# Patient Record
Sex: Male | Born: 1990 | Marital: Single | Smoking: Never smoker
Health system: Northeastern US, Academic
[De-identification: ages and names within clinical notes are randomized; demographics above are authoritative.]

## PROBLEM LIST (undated history)

## (undated) HISTORY — PX: TONSILLECTOMY: SUR1361

## (undated) HISTORY — PX: ADENOIDECTOMY: SHX5191

---
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
## 2009-04-15 ENCOUNTER — Emergency Department (HOSPITAL_COMMUNITY): Admission: EM | Admit: 2009-04-15 | Discharge: 2009-04-16 | Payer: Self-pay | Admitting: Emergency Medicine

## 2011-04-04 LAB — DIFFERENTIAL
Basophils Relative: 0 % (ref 0–1)
Eosinophils Absolute: 0 10*3/uL (ref 0.0–0.7)
Monocytes Absolute: 0.8 10*3/uL (ref 0.1–1.0)
Monocytes Relative: 5 % (ref 3–12)

## 2011-04-04 LAB — COMPREHENSIVE METABOLIC PANEL
ALT: 10 U/L (ref 0–53)
Albumin: 4.2 g/dL (ref 3.5–5.2)
Alkaline Phosphatase: 78 U/L (ref 39–117)
Glucose, Bld: 104 mg/dL — ABNORMAL HIGH (ref 70–99)
Potassium: 3.5 mEq/L (ref 3.5–5.1)
Sodium: 137 mEq/L (ref 135–145)
Total Protein: 7.3 g/dL (ref 6.0–8.3)

## 2011-04-04 LAB — CBC
Hemoglobin: 13.6 g/dL (ref 13.0–17.0)
Platelets: 215 10*3/uL (ref 150–400)
RDW: 13.3 % (ref 11.5–15.5)
WBC: 14.8 10*3/uL — ABNORMAL HIGH (ref 4.0–10.5)

## 2011-07-04 ENCOUNTER — Encounter: Payer: Self-pay | Admitting: *Deleted

## 2011-07-04 ENCOUNTER — Emergency Department (HOSPITAL_COMMUNITY)
Admission: EM | Admit: 2011-07-04 | Discharge: 2011-07-04 | Disposition: A | Discharge status: Home / Self Care | Payer: Self-pay | Attending: Emergency Medicine | Admitting: Emergency Medicine

## 2011-07-04 DIAGNOSIS — J069 Acute upper respiratory infection, unspecified: Secondary | ICD-10-CM | POA: Insufficient documentation

## 2011-07-04 MED ORDER — ALBUTEROL SULFATE HFA 108 (90 BASE) MCG/ACT IN AERS
2.0000 | INHALATION_SPRAY | Freq: Once | RESPIRATORY_TRACT | Status: AC
  Administered 2011-07-04: 2 via RESPIRATORY_TRACT
  Filled 2011-07-04: qty 6.7

## 2011-07-04 NOTE — ED Notes (Signed)
Pt c/o cough and body aches since Sunday. Denies productive cough. Denies fever.

## 2011-07-04 NOTE — ED Provider Notes (Addendum)
History     Chief Complaint  Patient presents with  . Cough   Patient is a 20 y.o. male presenting with cough. The history is provided by the patient.  Cough This is a new problem. The current episode started more than 2 days ago. The problem occurs every few hours. The problem has been gradually worsening. The cough is non-productive. Associated symptoms include sore throat and myalgias. Pertinent negatives include no chest pain, no sweats, no ear congestion, no ear pain, no rhinorrhea and no shortness of breath. He is not a smoker.  Pt was seen at 1235pm  Pt reports cough, myalgias and felt like he had a fever. Now just reports persistent cough. No other medical problems. No sick contacts. No new exposures.    History reviewed. No pertinent past medical history.  History reviewed. No pertinent past surgical history.  History reviewed. No pertinent family history.  History  Substance Use Topics  . Smoking status: Never Smoker   . Smokeless tobacco: Not on file  . Alcohol Use: Yes     Drinks occasionally      Review of Systems  HENT: Positive for sore throat. Negative for ear pain and rhinorrhea.   Respiratory: Positive for cough. Negative for shortness of breath.   Cardiovascular: Negative for chest pain.  Musculoskeletal: Positive for myalgias.    Physical Exam  BP 144/75  Pulse 67  Temp(Src) 98.9 F (37.2 C) (Oral)  Resp 20  Ht 5\' 7"  (1.702 m)  Wt 145 lb (65.772 kg)  BMI 22.71 kg/m2  SpO2 100%  Physical Exam   CONSTITUTIONAL: Well developed/well nourished HEAD AND FACE: Normocephalic/atraumatic EYES: EOMI/PERRL ENMT: Mucous membranes moist, pharynx normal NECK: supple no meningeal signs SPINE:entire spine nontender CV: S1/S2 noted, no murmurs/rubs/gallops noted LUNGS: Lungs are clear to auscultation bilaterally, no apparent distress but he does cough during exam ABDOMEN: soft, nontender, no rebound or guarding  NEURO: Pt is awake/alert, moves all  extremitiesx4 EXTREMITIES: pulses normal, full ROM, no edema noted SKIN: warm, color normal PSYCH: no abnormalities of mood noted ED Course  Procedures  MDM Nursing notes reviewed and considered in documentation Pt well appearing, likely URI.  Stable for d/c.  No indication for imaging at this time      Joya Gaskins, MD 07/04/11 1258  Joya Gaskins, MD 07/04/11 1259

## 2011-10-05 LAB — STREP A DNA PROBE

## 2013-01-15 ENCOUNTER — Emergency Department (HOSPITAL_COMMUNITY)
Admission: EM | Admit: 2013-01-15 | Discharge: 2013-01-15 | Disposition: A | Discharge status: Home / Self Care | Payer: Worker's Compensation | Attending: Emergency Medicine | Admitting: Emergency Medicine

## 2013-01-15 ENCOUNTER — Encounter (HOSPITAL_COMMUNITY): Payer: Self-pay | Admitting: Emergency Medicine

## 2013-01-15 DIAGNOSIS — S61419A Laceration without foreign body of unspecified hand, initial encounter: Secondary | ICD-10-CM

## 2013-01-15 DIAGNOSIS — S61409A Unspecified open wound of unspecified hand, initial encounter: Secondary | ICD-10-CM | POA: Insufficient documentation

## 2013-01-15 DIAGNOSIS — W292XXA Contact with other powered household machinery, initial encounter: Secondary | ICD-10-CM | POA: Insufficient documentation

## 2013-01-15 DIAGNOSIS — Y990 Civilian activity done for income or pay: Secondary | ICD-10-CM | POA: Insufficient documentation

## 2013-01-15 DIAGNOSIS — Y9289 Other specified places as the place of occurrence of the external cause: Secondary | ICD-10-CM | POA: Insufficient documentation

## 2013-01-15 DIAGNOSIS — Y939 Activity, unspecified: Secondary | ICD-10-CM | POA: Insufficient documentation

## 2013-01-15 DIAGNOSIS — Z23 Encounter for immunization: Secondary | ICD-10-CM | POA: Insufficient documentation

## 2013-01-15 DIAGNOSIS — Y929 Unspecified place or not applicable: Secondary | ICD-10-CM | POA: Insufficient documentation

## 2013-01-15 MED ORDER — HYDROCODONE-ACETAMINOPHEN 5-325 MG PO TABS: ORAL_TABLET | ORAL | Status: DC

## 2013-01-15 MED ORDER — DOXYCYCLINE HYCLATE 100 MG PO CAPS: 100.0000 mg | ORAL_CAPSULE | Freq: Two times a day (BID) | ORAL | Status: DC

## 2013-01-15 MED ORDER — TETANUS-DIPHTH-ACELL PERTUSSIS 5-2.5-18.5 LF-MCG/0.5 IM SUSP
0.5000 mL | Freq: Once | INTRAMUSCULAR | Status: AC
  Administered 2013-01-15: 0.5 mL via INTRAMUSCULAR
  Filled 2013-01-15: qty 0.5

## 2013-01-15 MED ORDER — LIDOCAINE HCL (PF) 2 % IJ SOLN
10.0000 mL | Freq: Once | INTRAMUSCULAR | Status: DC
  Filled 2013-01-15: qty 10

## 2013-01-15 MED ORDER — DOUBLE ANTIBIOTIC 500-10000 UNIT/GM EX OINT
TOPICAL_OINTMENT | Freq: Once | CUTANEOUS | Status: DC
  Filled 2013-01-15 (×2): qty 1

## 2013-01-15 NOTE — ED Provider Notes (Signed)
History     CSN: [id number]  Arrival date & time 01/15/13  4098   First MD Initiated Contact with Patient 01/15/13 [phone]      Chief Complaint  Patient presents with  . Laceration    (Consider location/radiation/quality/duration/timing/severity/associated sxs/prior treatment) Patient is a 22 y.o. male presenting with skin laceration. The history is provided by the patient.  Laceration  The incident occurred 1 to 2 hours ago. The laceration is located on the left hand. The laceration is 1 cm in size. The laceration mechanism was a a metal edge. The pain is moderate. The pain has been constant since onset. He reports no foreign bodies present. His tetanus status is out of date.    History reviewed. No pertinent past medical history.  Past Surgical History  Procedure Date  . Tonsillectomy   . Adenoidectomy     No family history on file.  History  Substance Use Topics  . Smoking status: Never Smoker   . Smokeless tobacco: Not on file  . Alcohol Use: Yes     Comment: Drinks occasionally      Review of Systems  Constitutional: Negative for activity change.       All ROS Neg except as noted in HPI  HENT: Negative for nosebleeds and neck pain.   Eyes: Negative for photophobia and discharge.  Respiratory: Negative for cough, shortness of breath and wheezing.   Cardiovascular: Negative for chest pain and palpitations.  Gastrointestinal: Negative for abdominal pain and blood in stool.  Genitourinary: Negative for dysuria, frequency and hematuria.  Musculoskeletal: Negative for back pain and arthralgias.  Skin: Negative.   Neurological: Negative for dizziness, seizures and speech difficulty.  Psychiatric/Behavioral: Negative for hallucinations and confusion.    Allergies  Review of patient's allergies indicates no known allergies.  Home Medications   Current Outpatient Rx  Name  Route  Sig  Dispense  Refill  . DOXYCYCLINE HYCLATE 100 MG PO CAPS   Oral   Take 1  capsule (100 mg total) by mouth 2 (two) times daily.   14 capsule   0   . HYDROCODONE-ACETAMINOPHEN 5-325 MG PO TABS      1 or 2 po q4h prn pain   14 tablet   0     BP 137/81  Pulse 50  Temp 97.8 F (36.6 C)  Resp 18  Ht 5\' 8"  (1.727 m)  Wt 150 lb (68.04 kg)  BMI 22.81 kg/m2  SpO2 99%  Physical Exam  Nursing note and vitals reviewed. Constitutional: He is oriented to person, place, and time. He appears well-developed and well-nourished.  Non-toxic appearance.  HENT:  Head: Normocephalic.  Right Ear: Tympanic membrane and external ear normal.  Left Ear: Tympanic membrane and external ear normal.  Eyes: EOM and lids are normal. Pupils are equal, round, and reactive to light.  Neck: Normal range of motion. Neck supple. Carotid bruit is not present.  Cardiovascular: Normal rate, regular rhythm, normal heart sounds, intact distal pulses and normal pulses.   Pulmonary/Chest: Breath sounds normal. No respiratory distress.  Abdominal: Soft. Bowel sounds are normal. There is no tenderness. There is no guarding.  Musculoskeletal: Normal range of motion.       PUNCTURE/LACERATION OF THE WEB SPACE BETWEEN LEFT THUMB AND 2ND FINGER. mILD TO MODERATE SWELLING PRESENT. FROM OF THE THUMB AND 2ND FINGER.  Lymphadenopathy:       Head (right side): No submandibular adenopathy present.       Head (left side): No submandibular  adenopathy present.    He has no cervical adenopathy.  Neurological: He is alert and oriented to person, place, and time. He has normal strength. No cranial nerve deficit or sensory deficit.       No sensory deficits of the left hand. Grip symmetrical.  Skin: Skin is warm and dry.  Psychiatric: He has a normal mood and affect. His speech is normal.    ED Course  Procedures: Laceration repair LEFT HAND.  The patient was identified by arm band. Permission for the procedure was given by the patient. Procedural time out was taken before repair of the laceration/puncture  of the left hand. The procedure was explained to the patient in terms which she could understand. The left hand was then painted with Betadine. The wound area was infiltrated with 2% plain lidocaine. After good anesthetic was achieved the wound was investigated and irrigated. There is no pulsatile mass appreciated. There was a mild to moderate hematoma around the laceration. There is no foreign body noted. No pulsating vessel or mass. Wound  was then painted again with Betadine and using sterile technique the wound was repaired with 2 staples. The wound measures 1.1 cm. A sterile dressing was applied. The patient tolerated the procedure without any problem.  After the repair the patient has full range of motion of the left thumb and index finger. Sensory is intact.  Labs Reviewed - No data to display No results found.   1. Laceration of hand      MDM  I have reviewed nursing notes, vital signs, and all appropriate lab and imaging results for this patient. The pt sustained a puncture/laceration of the left hand. Wound repaired. Tetanus updated. Pt to have staples removed in 7 days unless problems before then. Pt placed on doxycycline as a precaution due to puncture to the hand.       Kathie Dike, [place] 01/15/13 1043

## 2013-01-15 NOTE — ED Provider Notes (Signed)
Medical screening examination/treatment/procedure(s) were performed by non-physician practitioner and as supervising physician I was immediately available for consultation/collaboration.   Aldous Housel L Sincere Berlanga, MD 01/15/13 1629 

## 2013-01-15 NOTE — ED Notes (Signed)
Pt accidentally cut his left hand with metal knife while at work this am. Last tetanus unknown.

## 2013-01-20 ENCOUNTER — Encounter (HOSPITAL_COMMUNITY): Payer: Self-pay | Admitting: *Deleted

## 2013-01-20 ENCOUNTER — Emergency Department (HOSPITAL_COMMUNITY)
Admission: EM | Admit: 2013-01-20 | Discharge: 2013-01-20 | Disposition: A | Discharge status: Home / Self Care | Payer: Worker's Compensation | Attending: Emergency Medicine | Admitting: Emergency Medicine

## 2013-01-20 DIAGNOSIS — IMO0001 Reserved for inherently not codable concepts without codable children: Secondary | ICD-10-CM

## 2013-01-20 DIAGNOSIS — Z4802 Encounter for removal of sutures: Secondary | ICD-10-CM | POA: Insufficient documentation

## 2013-01-20 DIAGNOSIS — Z4800 Encounter for change or removal of nonsurgical wound dressing: Secondary | ICD-10-CM | POA: Insufficient documentation

## 2013-01-20 NOTE — ED Provider Notes (Signed)
History     CSN: [id number]  Arrival date & time 01/20/13  1542   First MD Initiated Contact with Patient 01/20/13 1603      Chief Complaint  Patient presents with  . Suture / Staple Removal    (Consider location/radiation/quality/duration/timing/severity/associated sxs/prior treatment) HPI Comments: Patient was seen here 5 days ago and treated for a laceration to his left hand.  Requesting wound check and staple removal.  He reports swelling to the left index finger, but denies numbness, weakness, redness or drainage surrounding the wound.    Patient is a 22 y.o. male presenting with wound check. The history is provided by the patient.  Wound Check  He was treated in the ED 3 to 5 days ago. Previous treatment in the ED includes laceration repair. Treatments since wound repair include regular soap and water washings. Fever duration: no fever. There has been no drainage from the wound. There is no redness present. The swelling has not changed. The pain has no pain. He has no difficulty moving the affected extremity or digit.    History reviewed. No pertinent past medical history.  Past Surgical History  Procedure Date  . Tonsillectomy   . Adenoidectomy     History reviewed. No pertinent family history.  History  Substance Use Topics  . Smoking status: Never Smoker   . Smokeless tobacco: Not on file  . Alcohol Use: Yes     Comment: Drinks occasionally      Review of Systems  Constitutional: Negative for fever and chills.  Musculoskeletal: Positive for joint swelling. Negative for back pain and arthralgias.  Skin: Positive for wound.       Laceration   Neurological: Negative for dizziness, weakness and numbness.  Hematological: Does not bruise/bleed easily.  All other systems reviewed and are negative.    Allergies  Vibramycin  Home Medications   Current Outpatient Rx  Name  Route  Sig  Dispense  Refill  . DOXYCYCLINE HYCLATE 100 MG PO CAPS   Oral   Take 1  capsule (100 mg total) by mouth 2 (two) times daily.   14 capsule   0   . HYDROCODONE-ACETAMINOPHEN 5-325 MG PO TABS      1 or 2 po q4h prn pain   14 tablet   0     BP 135/63  Pulse 64  SpO2 100%  Physical Exam  Nursing note and vitals reviewed. Constitutional: He is oriented to person, place, and time. He appears well-developed and well-nourished. No distress.  HENT:  Head: Normocephalic and atraumatic.  Mouth/Throat: Oropharynx is clear and moist.  Neck: Normal range of motion. Neck supple.  Cardiovascular: Normal rate, regular rhythm and normal heart sounds.   No murmur heard. Pulmonary/Chest: Effort normal and breath sounds normal.  Musculoskeletal: He exhibits edema. He exhibits no tenderness.       Left hand: He exhibits laceration. He exhibits normal range of motion, no tenderness, no bony tenderness, normal two-point discrimination, normal capillary refill and no deformity. normal sensation noted. Normal strength noted.       Hands:      Sensation intact, CR< 2 sec, radial pulse brisk  Lymphadenopathy:    He has no cervical adenopathy.  Neurological: He is alert and oriented to person, place, and time. He exhibits normal muscle tone. Coordination normal.  Skin: Laceration noted.       See MS exam    ED Course  Procedures (including critical care time)  Labs Reviewed - No  data to display No results found.      MDM   Previous ed chart reviewed.     Two staples to the first web space of the  Left hand.  Appears to be healing well. No wound dehiscence.   Pt has full ROM of the fingers, distal sensation intact.  Will leave staples in 3 more days.  Pt agrees to return then        Katiria Calame L. [place], [place] 01/20/13 1934

## 2013-01-20 NOTE — ED Notes (Addendum)
Here for staple removal from lt hand Staples placed 1/ 23. Stopped taking antibiotic, says it caused a headache.

## 2013-01-20 NOTE — ED Provider Notes (Signed)
Medical screening examination/treatment/procedure(s) were performed by non-physician practitioner and as supervising physician I was immediately available for consultation/collaboration.   Shelda Jakes, MD 01/20/13 2009

## 2013-01-24 ENCOUNTER — Emergency Department (HOSPITAL_COMMUNITY)
Admission: EM | Admit: 2013-01-24 | Discharge: 2013-01-24 | Disposition: A | Discharge status: Home / Self Care | Payer: Worker's Compensation | Attending: Emergency Medicine | Admitting: Emergency Medicine

## 2013-01-24 ENCOUNTER — Encounter (HOSPITAL_COMMUNITY): Payer: Self-pay | Admitting: *Deleted

## 2013-01-24 DIAGNOSIS — Z4802 Encounter for removal of sutures: Secondary | ICD-10-CM | POA: Insufficient documentation

## 2013-01-24 NOTE — ED Notes (Signed)
Staples removed and steri stirps placed to area. NAD

## 2013-01-24 NOTE — ED Provider Notes (Addendum)
History     CSN: [id number]  Arrival date & time 01/24/13  1350   First MD Initiated Contact with Patient 01/24/13 1357       chief complaint: Staple removal  The history is provided by the patient and medical records.   staples placed in his left hand 10 days ago.  Request removal today.  No fevers or chills or redness.  No drainage.  No other complaints.  History reviewed. No pertinent past medical history.  Past Surgical History  Procedure Date  . Tonsillectomy   . Adenoidectomy     No family history on file.  History  Substance Use Topics  . Smoking status: Never Smoker   . Smokeless tobacco: Not on file  . Alcohol Use: Yes     Comment: Drinks occasionally      Review of Systems  All other systems reviewed and are negative.    Allergies  Vibramycin  Home Medications   Current Outpatient Rx  Name  Route  Sig  Dispense  Refill  . DOXYCYCLINE HYCLATE 100 MG PO CAPS   Oral   Take 1 capsule (100 mg total) by mouth 2 (two) times daily.   14 capsule   0   . HYDROCODONE-ACETAMINOPHEN 5-325 MG PO TABS      1 or 2 po q4h prn pain   14 tablet   0     BP 117/68  Pulse 63  Temp 97.8 F (36.6 C) (Oral)  Resp 16  SpO2 98%  Physical Exam  Constitutional: He is oriented to person, place, and time. He appears well-developed and well-nourished.  HENT:  Head: Normocephalic.  Eyes: EOM are normal.  Neck: Normal range of motion.  Pulmonary/Chest: Effort normal.  Abdominal: He exhibits no distension.  Musculoskeletal: Normal range of motion.       Healing staples over the dorsum of his left hand.  No signs of infection.  Neurological: He is alert and oriented to person, place, and time.  Psychiatric: He has a normal mood and affect.    ED Course  Procedures (including critical care time)  SUTURE REMOVAL Performed by: Lyanne Co Consent: Verbal consent obtained. Patient identity confirmed: provided demographic data Time out: Immediately prior to  procedure a "time out" was called to verify the correct patient, procedure, equipment, support staff and site/side marked as required. Location: left hand Wound Appearance: clean Sutures/Staples Removed: 2 Patient tolerance: Patient tolerated the procedure well with no immediate complications.     Labs Reviewed - No data to display No results found.   1. Removal of staple       MDM  No signs of infection. steri strips applied        Lyanne Co, MD 01/24/13 1400  Lyanne Co, MD 01/24/13 [phone]

## 2013-01-24 NOTE — ED Notes (Signed)
Staple removal

## 2016-06-09 ENCOUNTER — Emergency Department (HOSPITAL_COMMUNITY): Payer: No Typology Code available for payment source

## 2016-06-09 ENCOUNTER — Encounter (HOSPITAL_COMMUNITY)
Admission: EM | Disposition: A | Discharge status: Home / Self Care | Payer: Self-pay | Source: Home / Self Care | Attending: Emergency Medicine

## 2016-06-09 ENCOUNTER — Observation Stay (HOSPITAL_COMMUNITY)
Admission: EM | Admit: 2016-06-09 | Discharge: 2016-06-11 | Disposition: A | Discharge status: Home / Self Care | Payer: No Typology Code available for payment source | Attending: Orthopedic Surgery | Admitting: Orthopedic Surgery

## 2016-06-09 ENCOUNTER — Emergency Department (HOSPITAL_COMMUNITY): Payer: No Typology Code available for payment source | Admitting: Anesthesiology

## 2016-06-09 ENCOUNTER — Encounter (HOSPITAL_COMMUNITY): Payer: Self-pay | Admitting: Emergency Medicine

## 2016-06-09 DIAGNOSIS — R079 Chest pain, unspecified: Secondary | ICD-10-CM | POA: Diagnosis not present

## 2016-06-09 DIAGNOSIS — Y999 Unspecified external cause status: Secondary | ICD-10-CM | POA: Insufficient documentation

## 2016-06-09 DIAGNOSIS — S73004A Unspecified dislocation of right hip, initial encounter: Principal | ICD-10-CM | POA: Insufficient documentation

## 2016-06-09 DIAGNOSIS — Y92410 Unspecified street and highway as the place of occurrence of the external cause: Secondary | ICD-10-CM | POA: Insufficient documentation

## 2016-06-09 DIAGNOSIS — R51 Headache: Secondary | ICD-10-CM | POA: Insufficient documentation

## 2016-06-09 DIAGNOSIS — S79911A Unspecified injury of right hip, initial encounter: Secondary | ICD-10-CM | POA: Diagnosis present

## 2016-06-09 DIAGNOSIS — S00211A Abrasion of right eyelid and periocular area, initial encounter: Secondary | ICD-10-CM | POA: Diagnosis not present

## 2016-06-09 DIAGNOSIS — S72009A Fracture of unspecified part of neck of unspecified femur, initial encounter for closed fracture: Secondary | ICD-10-CM | POA: Diagnosis present

## 2016-06-09 DIAGNOSIS — S72001A Fracture of unspecified part of neck of right femur, initial encounter for closed fracture: Secondary | ICD-10-CM

## 2016-06-09 DIAGNOSIS — R52 Pain, unspecified: Secondary | ICD-10-CM

## 2016-06-09 DIAGNOSIS — R1084 Generalized abdominal pain: Secondary | ICD-10-CM | POA: Diagnosis not present

## 2016-06-09 DIAGNOSIS — Y939 Activity, unspecified: Secondary | ICD-10-CM | POA: Diagnosis not present

## 2016-06-09 HISTORY — PX: HIP CLOSED REDUCTION: SHX983

## 2016-06-09 LAB — BASIC METABOLIC PANEL
Anion gap: 4 — ABNORMAL LOW (ref 5–15)
BUN: 16 mg/dL (ref 6–20)
CHLORIDE: 103 mmol/L (ref 101–111)
CO2: 25 mmol/L (ref 22–32)
Calcium: 8.3 mg/dL — ABNORMAL LOW (ref 8.9–10.3)
Creatinine, Ser: 1.02 mg/dL (ref 0.61–1.24)
GFR calc Af Amer: >60 mL/min (ref >60)
GFR calc non Af Amer: >60 mL/min (ref >60)
GLUCOSE: 108 mg/dL — AB (ref 65–99)
POTASSIUM: 3.9 mmol/L (ref 3.5–5.1)
Sodium: 132 mmol/L — ABNORMAL LOW (ref 135–145)

## 2016-06-09 LAB — I-STAT CHEM 8, ED
BUN: 18 mg/dL (ref 6–20)
CALCIUM ION: 1.16 mmol/L (ref 1.12–1.23)
Chloride: 101 mmol/L (ref 101–111)
Creatinine, Ser: 1.3 mg/dL — ABNORMAL HIGH (ref 0.61–1.24)
GLUCOSE: 99 mg/dL (ref 65–99)
HCT: 44 % (ref 39.0–52.0)
Hemoglobin: 15 g/dL (ref 13.0–17.0)
Potassium: 3.3 mmol/L — ABNORMAL LOW (ref 3.5–5.1)
SODIUM: 140 mmol/L (ref 135–145)
TCO2: 25 mmol/L (ref 0–100)

## 2016-06-09 LAB — CBC WITH DIFFERENTIAL/PLATELET
Basophils Absolute: 0 10*3/uL (ref 0.0–0.1)
Basophils Relative: 0 %
EOS PCT: 0 %
Eosinophils Absolute: 0 10*3/uL (ref 0.0–0.7)
HEMATOCRIT: 40.8 % (ref 39.0–52.0)
HEMOGLOBIN: 14.2 g/dL (ref 13.0–17.0)
LYMPHS ABS: 0.8 10*3/uL (ref 0.7–4.0)
LYMPHS PCT: 5 %
MCH: 29.3 pg (ref 26.0–34.0)
MCHC: 34.8 g/dL (ref 30.0–36.0)
MCV: 84.1 fL (ref 78.0–100.0)
Monocytes Absolute: 0.6 10*3/uL (ref 0.1–1.0)
Monocytes Relative: 3 %
NEUTROS ABS: 16.1 10*3/uL — AB (ref 1.7–7.7)
NEUTROS PCT: 92 %
Platelets: 253 10*3/uL (ref 150–400)
RBC: 4.85 MIL/uL (ref 4.22–5.81)
RDW: 12.5 % (ref 11.5–15.5)
WBC: 17.5 10*3/uL — AB (ref 4.0–10.5)

## 2016-06-09 SURGERY — CLOSED REDUCTION, HIP
Anesthesia: General | Laterality: Right

## 2016-06-09 MED ORDER — ONDANSETRON HCL 4 MG/2ML IJ SOLN: 4.0000 mg | Freq: Four times a day (QID) | INTRAMUSCULAR | Status: DC | PRN

## 2016-06-09 MED ORDER — FENTANYL CITRATE (PF) 100 MCG/2ML IJ SOLN
INTRAMUSCULAR | Status: DC | PRN
  Administered 2016-06-09 (×3): 50 ug via INTRAVENOUS

## 2016-06-09 MED ORDER — PHENYLEPHRINE 40 MCG/ML (10ML) SYRINGE FOR IV PUSH (FOR BLOOD PRESSURE SUPPORT)
PREFILLED_SYRINGE | INTRAVENOUS | Status: AC
  Filled 2016-06-09: qty 10

## 2016-06-09 MED ORDER — METOCLOPRAMIDE HCL 5 MG/ML IJ SOLN: 5.0000 mg | Freq: Three times a day (TID) | INTRAMUSCULAR | Status: DC | PRN

## 2016-06-09 MED ORDER — PROPOFOL 10 MG/ML IV BOLUS
INTRAVENOUS | Status: DC | PRN
  Administered 2016-06-09: 130 mg via INTRAVENOUS

## 2016-06-09 MED ORDER — KETOROLAC TROMETHAMINE 15 MG/ML IJ SOLN
15.0000 mg | Freq: Four times a day (QID) | INTRAMUSCULAR | Status: AC
  Administered 2016-06-09 – 2016-06-10 (×4): 15 mg via INTRAVENOUS
  Filled 2016-06-09 (×4): qty 1

## 2016-06-09 MED ORDER — CELECOXIB 100 MG PO CAPS
200.0000 mg | ORAL_CAPSULE | Freq: Two times a day (BID) | ORAL | Status: DC
  Administered 2016-06-09 – 2016-06-11 (×5): 200 mg via ORAL
  Filled 2016-06-09 (×5): qty 2

## 2016-06-09 MED ORDER — MIDAZOLAM HCL 5 MG/5ML IJ SOLN
INTRAMUSCULAR | Status: DC | PRN
  Administered 2016-06-09 (×2): 1 mg via INTRAVENOUS

## 2016-06-09 MED ORDER — LIDOCAINE HCL (PF) 1 % IJ SOLN
INTRAMUSCULAR | Status: AC
  Filled 2016-06-09: qty 5

## 2016-06-09 MED ORDER — DIPHENHYDRAMINE HCL 12.5 MG/5ML PO ELIX: 12.5000 mg | ORAL_SOLUTION | ORAL | Status: DC | PRN

## 2016-06-09 MED ORDER — ACETAMINOPHEN 650 MG RE SUPP: 650.0000 mg | Freq: Four times a day (QID) | RECTAL | Status: DC | PRN

## 2016-06-09 MED ORDER — CHLORHEXIDINE GLUCONATE 4 % EX LIQD
60.0000 mL | Freq: Once | CUTANEOUS | Status: DC
  Filled 2016-06-09: qty 60

## 2016-06-09 MED ORDER — SODIUM CHLORIDE 0.9 % IV SOLN
INTRAVENOUS | Status: DC
  Administered 2016-06-09: 04:00:00 via INTRAVENOUS

## 2016-06-09 MED ORDER — MIDAZOLAM HCL 2 MG/2ML IJ SOLN
INTRAMUSCULAR | Status: AC
  Filled 2016-06-09: qty 4

## 2016-06-09 MED ORDER — SUCCINYLCHOLINE CHLORIDE 200 MG/10ML IV SOSY
PREFILLED_SYRINGE | INTRAVENOUS | Status: DC | PRN
  Administered 2016-06-09: 120 mg via INTRAVENOUS

## 2016-06-09 MED ORDER — SUGAMMADEX SODIUM 500 MG/5ML IV SOLN
INTRAVENOUS | Status: DC | PRN
  Administered 2016-06-09: 250 mg via INTRAVENOUS

## 2016-06-09 MED ORDER — POVIDONE-IODINE 10 % EX SWAB: 2.0000 "application " | Freq: Once | CUTANEOUS | Status: DC

## 2016-06-09 MED ORDER — POLYETHYLENE GLYCOL 3350 17 G PO PACK: 17.0000 g | PACK | Freq: Every day | ORAL | Status: DC | PRN

## 2016-06-09 MED ORDER — SEVOFLURANE IN SOLN
RESPIRATORY_TRACT | Status: AC
  Filled 2016-06-09: qty 250

## 2016-06-09 MED ORDER — BISACODYL 10 MG RE SUPP: 10.0000 mg | Freq: Every day | RECTAL | Status: DC | PRN

## 2016-06-09 MED ORDER — ONDANSETRON HCL 4 MG/2ML IJ SOLN
4.0000 mg | INTRAMUSCULAR | Status: DC | PRN
  Administered 2016-06-09: 4 mg via INTRAVENOUS
  Filled 2016-06-09: qty 2

## 2016-06-09 MED ORDER — HYDROCODONE-ACETAMINOPHEN 7.5-325 MG PO TABS
1.0000 | ORAL_TABLET | Freq: Four times a day (QID) | ORAL | Status: DC
  Administered 2016-06-09 – 2016-06-11 (×9): 1 via ORAL
  Filled 2016-06-09 (×9): qty 1

## 2016-06-09 MED ORDER — OXYCODONE HCL 5 MG PO TABS: 5.0000 mg | ORAL_TABLET | ORAL | Status: DC | PRN

## 2016-06-09 MED ORDER — SENNA 8.6 MG PO TABS
1.0000 | ORAL_TABLET | Freq: Two times a day (BID) | ORAL | Status: DC
  Administered 2016-06-09 – 2016-06-11 (×5): 8.6 mg via ORAL
  Filled 2016-06-09 (×5): qty 1

## 2016-06-09 MED ORDER — ROCURONIUM BROMIDE 100 MG/10ML IV SOLN
INTRAVENOUS | Status: DC | PRN
  Administered 2016-06-09: 5 mg via INTRAVENOUS
  Administered 2016-06-09: 25 mg via INTRAVENOUS
  Administered 2016-06-09 (×2): 10 mg via INTRAVENOUS

## 2016-06-09 MED ORDER — IOPAMIDOL (ISOVUE-300) INJECTION 61%
100.0000 mL | Freq: Once | INTRAVENOUS | Status: AC | PRN
  Administered 2016-06-09: 100 mL via INTRAVENOUS

## 2016-06-09 MED ORDER — PHENOL 1.4 % MT LIQD: 1.0000 | OROMUCOSAL | Status: DC | PRN

## 2016-06-09 MED ORDER — HYDROMORPHONE HCL 1 MG/ML IJ SOLN: 0.5000 mg | INTRAMUSCULAR | Status: DC | PRN

## 2016-06-09 MED ORDER — PROPOFOL 10 MG/ML IV BOLUS
0.5000 mg/kg | Freq: Once | INTRAVENOUS | Status: AC
  Administered 2016-06-09: 39 mg via INTRAVENOUS
  Filled 2016-06-09: qty 20

## 2016-06-09 MED ORDER — METHOCARBAMOL 1000 MG/10ML IJ SOLN
500.0000 mg | Freq: Four times a day (QID) | INTRAVENOUS | Status: DC | PRN
  Filled 2016-06-09: qty 5

## 2016-06-09 MED ORDER — METHOCARBAMOL 500 MG PO TABS
500.0000 mg | ORAL_TABLET | Freq: Four times a day (QID) | ORAL | Status: DC | PRN
  Administered 2016-06-09 – 2016-06-10 (×4): 500 mg via ORAL
  Filled 2016-06-09 (×4): qty 1

## 2016-06-09 MED ORDER — LIDOCAINE HCL 1 % IJ SOLN
INTRAMUSCULAR | Status: DC | PRN
  Administered 2016-06-09: 40 mg via INTRADERMAL

## 2016-06-09 MED ORDER — ACETAMINOPHEN 325 MG PO TABS: 650.0000 mg | ORAL_TABLET | Freq: Four times a day (QID) | ORAL | Status: DC | PRN

## 2016-06-09 MED ORDER — SUCCINYLCHOLINE CHLORIDE 20 MG/ML IJ SOLN
INTRAMUSCULAR | Status: AC
  Filled 2016-06-09: qty 1

## 2016-06-09 MED ORDER — ROCURONIUM BROMIDE 50 MG/5ML IV SOLN
INTRAVENOUS | Status: AC
  Filled 2016-06-09: qty 1

## 2016-06-09 MED ORDER — PROPOFOL 10 MG/ML IV BOLUS
INTRAVENOUS | Status: AC
  Filled 2016-06-09: qty 20

## 2016-06-09 MED ORDER — MENTHOL 3 MG MT LOZG: 1.0000 | LOZENGE | OROMUCOSAL | Status: DC | PRN

## 2016-06-09 MED ORDER — METOCLOPRAMIDE HCL 10 MG PO TABS: 5.0000 mg | ORAL_TABLET | Freq: Three times a day (TID) | ORAL | Status: DC | PRN

## 2016-06-09 MED ORDER — SUGAMMADEX SODIUM 500 MG/5ML IV SOLN
INTRAVENOUS | Status: AC
  Filled 2016-06-09: qty 5

## 2016-06-09 MED ORDER — ENOXAPARIN SODIUM 30 MG/0.3ML ~~LOC~~ SOLN
30.0000 mg | Freq: Two times a day (BID) | SUBCUTANEOUS | Status: DC
  Administered 2016-06-10 – 2016-06-11 (×2): 30 mg via SUBCUTANEOUS
  Filled 2016-06-09 (×3): qty 0.3

## 2016-06-09 MED ORDER — MAGNESIUM CITRATE PO SOLN: 1.0000 | Freq: Once | ORAL | Status: DC | PRN

## 2016-06-09 MED ORDER — FENTANYL CITRATE (PF) 100 MCG/2ML IJ SOLN
50.0000 ug | INTRAMUSCULAR | Status: AC | PRN
  Administered 2016-06-09 (×2): 25 ug via INTRAVENOUS
  Filled 2016-06-09: qty 2

## 2016-06-09 MED ORDER — ONDANSETRON HCL 4 MG PO TABS: 4.0000 mg | ORAL_TABLET | Freq: Four times a day (QID) | ORAL | Status: DC | PRN

## 2016-06-09 MED ORDER — MORPHINE SULFATE (PF) 4 MG/ML IV SOLN
4.0000 mg | INTRAVENOUS | Status: DC | PRN
Start: 2016-06-09 — End: 2016-06-09
  Administered 2016-06-09: 4 mg via INTRAVENOUS
  Filled 2016-06-09: qty 1

## 2016-06-09 MED ORDER — LACTATED RINGERS IV SOLN
INTRAVENOUS | Status: DC | PRN
  Administered 2016-06-09 (×2): via INTRAVENOUS

## 2016-06-09 SURGICAL SUPPLY — 10 items
CLOTH BEACON ORANGE TIMEOUT ST (SAFETY) ×3 IMPLANT
GLOVE BIOGEL PI IND STRL 7.0 (GLOVE) ×1 IMPLANT
GLOVE BIOGEL PI INDICATOR 7.0 (GLOVE) ×2
GOWN STRL REUS W/TWL LRG LVL3 (GOWN DISPOSABLE) IMPLANT
GOWN STRL REUS W/TWL XL LVL3 (GOWN DISPOSABLE) IMPLANT
IMMOBILIZER KNEE 19 UNV (ORTHOPEDIC SUPPLIES) ×3 IMPLANT
KIT ROOM TURNOVER APOR (KITS) ×3 IMPLANT
PILLOW HIP ABDUCTION LRG (ORTHOPEDIC SUPPLIES) IMPLANT
PILLOW HIP ABDUCTION MED (ORTHOPEDIC SUPPLIES) IMPLANT
PILLOW HIP ABDUCTION SM (ORTHOPEDIC SUPPLIES) IMPLANT

## 2016-06-09 NOTE — Anesthesia Procedure Notes (Signed)
Procedure Name: Intubation Date/Time: 06/09/2016 8:23 AM Performed by: Glynn OctaveANIEL, Diani Jillson E Pre-anesthesia Checklist: Patient identified, Patient being monitored, Timeout performed, Emergency Drugs available and Suction available Patient Re-evaluated:Patient Re-evaluated prior to inductionOxygen Delivery Method: Circle System Utilized Preoxygenation: Pre-oxygenation with 100% oxygen Intubation Type: IV induction, Rapid sequence and Cricoid Pressure applied Ventilation: Mask ventilation without difficulty Laryngoscope Size: Mac and 3 Grade View: Grade II Tube type: Oral Tube size: 7.0 mm Number of attempts: 1 Airway Equipment and Method: stylet Placement Confirmation: ETT inserted through vocal cords under direct vision,  positive ETCO2 and breath sounds checked- equal and bilateral Secured at: 21 cm Tube secured with: Tape Dental Injury: Teeth and Oropharynx as per pre-operative assessment

## 2016-06-09 NOTE — Progress Notes (Signed)
PT Cancellation Note  Patient Details Name: Philis KendallSean R Quirarte MRN: [id number] DOB: April 08, 1991   Cancelled Treatment:    Reason Eval/Treat Not Completed: Pain limiting ability to participate (Stopped by room to check on patient. He reports he does not feel 'groggy' but is wide awake. He says that his pain is 9/10 right now and he is waiting for pain meds. He is aprehensive about AMB at this time and would rather defer until tomorrow morning. PT is agreeable. Will attempt again at later/date time.)   11:38 AM, 06/09/2016 Rosamaria LintsAllan C Buccola, PT, DPT PRN Physical Therapist - [facility] License # [id number] [phone] (mobile)
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
## 2016-06-09 NOTE — ED Notes (Signed)
To radiology via stretcher.

## 2016-06-09 NOTE — Addendum Note (Signed)
Addendum  created 06/09/16 1112 by Moshe SalisburyKaren E Marino Rogerson, CRNA   Modules edited: Anesthesia Flowsheet, Anesthesia Medication Administration

## 2016-06-09 NOTE — Anesthesia Postprocedure Evaluation (Signed)
Anesthesia Post Note  Patient: Jeremiah Anthony  Procedure(s) Performed: Procedure(s) (LRB): CLOSED REDUCTION RIGHT HIP (Right)  Patient location during evaluation: PACU Anesthesia Type: General Level of consciousness: awake and alert and oriented Pain management: pain level controlled Vital Signs Assessment: post-procedure vital signs reviewed and stable Respiratory status: spontaneous breathing Cardiovascular status: blood pressure returned to baseline Postop Assessment: no signs of nausea or vomiting Anesthetic complications: no    Last Vitals:  Filed Vitals:   06/09/16 1000 06/09/16 1015  BP: 111/67 117/69  Pulse: 58 54  Temp:    Resp: 14 13    Last Pain:  Filed Vitals:   06/09/16 1021  PainSc: Asleep                 Mattisyn Cardona

## 2016-06-09 NOTE — Anesthesia Preprocedure Evaluation (Addendum)
Anesthesia Evaluation  Patient identified by MRN, date of birth, ID band Patient awake  General Assessment Comment:Not on a Beta blocker  Reviewed: Allergy & Precautions, NPO status , Patient's Chart, lab work & pertinent test results  History of Anesthesia Complications Negative for: history of anesthetic complications  Airway Mallampati: II  TM Distance: >3 FB Neck ROM: Full    Dental  (+) Teeth Intact   Pulmonary neg pneumonia ,    Pulmonary exam normal        Cardiovascular Exercise Tolerance: Good negative cardio ROS   Rhythm:Regular Rate:Normal     Neuro/Psych    GI/Hepatic negative GI ROS, Neg liver ROS,   Endo/Other  negative endocrine ROS  Renal/GU negative Renal ROS     Musculoskeletal negative musculoskeletal ROS (+)   Abdominal Normal abdominal exam  (+)   Peds  Hematology negative hematology ROS (+)   Anesthesia Other Findings   Reproductive/Obstetrics negative OB ROS                           Anesthesia Physical Anesthesia Plan  ASA: I and emergent  Anesthesia Plan: General   Post-op Pain Management:    Induction: Rapid sequence, Cricoid pressure planned and Intravenous  Airway Management Planned: Oral ETT  Additional Equipment:   Intra-op Plan:   Post-operative Plan:   Informed Consent: I have reviewed the patients History and Physical, chart, labs and discussed the procedure including the risks, benefits and alternatives for the proposed anesthesia with the patient or authorized representative who has indicated his/her understanding and acceptance.     Plan Discussed with: Surgeon  Anesthesia Plan Comments:         Anesthesia Quick Evaluation

## 2016-06-09 NOTE — ED Notes (Signed)
OR crew arrived to assume care of patient.

## 2016-06-09 NOTE — Brief Op Note (Signed)
06/09/2016  9:02 AM  PATIENT:  Jeremiah Anthony  25 y.o. male  PRE-OPERATIVE DIAGNOSIS:  dislocation right hip  POST-OPERATIVE DIAGNOSIS:  dislocation right hip  PROCEDURE:  Procedure(s): CLOSED REDUCTION RIGHT HIP (Right)  SURGEON:  Surgeon(s) and Role:    * Vickki HearingStanley E Harrison, MD - Primary  PHYSICIAN ASSISTANT:   ASSISTANTS: none   ANESTHESIA:   general  EBL:     BLOOD ADMINISTERED:none  DRAINS: none   LOCAL MEDICATIONS USED:  NONE  SPECIMEN:  No Specimen  DISPOSITION OF SPECIMEN:  N/A  COUNTS:  YES  TOURNIQUET:  * No tourniquets in log *  DICTATION: .Dragon Dictation  PLAN OF CARE: Admit for overnight observation  PATIENT DISPOSITION:  PACU - hemodynamically stable.   Delay start of Pharmacological VTE agent (>24hrs) due to surgical blood loss or risk of bleeding: not applicable

## 2016-06-09 NOTE — Transfer of Care (Signed)
Immediate Anesthesia Transfer of Care Note  Patient: Jeremiah Anthony  Procedure(s) Performed: Procedure(s): CLOSED REDUCTION RIGHT HIP (Right)  Patient Location: PACU  Anesthesia Type:General  Level of Consciousness: awake  Airway & Oxygen Therapy: Patient Spontanous Breathing and Patient connected to face mask oxygen  Post-op Assessment: Report given to RN  Post vital signs: Reviewed and stable  Last Vitals:  Filed Vitals:   06/09/16 0730 06/09/16 0745  BP: 126/70 125/76  Pulse:    Temp:    Resp: 13 17    Last Pain:  Filed Vitals:   06/09/16 0756  PainSc: 3          Complications: No apparent anesthesia complications
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
## 2016-06-09 NOTE — ED Notes (Signed)
Spot check o2 sat 100%.
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
## 2016-06-10 DIAGNOSIS — S72001A Fracture of unspecified part of neck of right femur, initial encounter for closed fracture: Secondary | ICD-10-CM | POA: Diagnosis not present

## 2016-06-10 MED ORDER — IBUPROFEN 800 MG PO TABS
800.0000 mg | ORAL_TABLET | Freq: Three times a day (TID) | ORAL | Status: AC | PRN
Start: 2016-06-10 — End: ?

## 2016-06-10 MED ORDER — HYDROCODONE-ACETAMINOPHEN 7.5-325 MG PO TABS: 1.0000 | ORAL_TABLET | Freq: Four times a day (QID) | ORAL | Status: AC

## 2016-06-10 NOTE — Anesthesia Postprocedure Evaluation (Signed)
Anesthesia Post Note  Patient: Jeremiah Anthony  Procedure(s) Performed: Procedure(s) (LRB): CLOSED REDUCTION RIGHT HIP (Right)  Patient location during evaluation: Nursing Unit Anesthesia Type: General Level of consciousness: awake and alert and oriented Pain management: pain level controlled Vital Signs Assessment: post-procedure vital signs reviewed and stable Respiratory status: spontaneous breathing Cardiovascular status: blood pressure returned to baseline Postop Assessment: adequate PO intake Anesthetic complications: no    Last Vitals:  Filed Vitals:   06/10/16 0635 06/10/16 1348  BP: 119/67 110/68  Pulse: 71 82  Temp: 36.7 C 36.6 C  Resp: 16 20    Last Pain:  Filed Vitals:   06/10/16 1348  PainSc: 3                  Amery Minasyan
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
## 2016-06-10 NOTE — Addendum Note (Signed)
Addendum  created 06/10/16 1810 by Moshe SalisburyKaren E Terianna Peggs, CRNA   Modules edited: Clinical Notes   Clinical Notes:  File: [id number]
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
## 2016-06-10 NOTE — Progress Notes (Signed)
CM was able to speak with patient and provided the contact number for financial counseling.   Also provided the number for [facility] if equipment needed to rent instead of purchasing.  CM spoke with PT and patient will need a elevated BSC and cane to help with walking.   Patient may need assistance with medications once discharged. Provided [facility] list with medications with medicines that had discounted price.

## 2016-06-11 ENCOUNTER — Encounter (HOSPITAL_COMMUNITY): Payer: Self-pay | Admitting: Orthopedic Surgery

## 2016-06-11 ENCOUNTER — Telehealth: Payer: Self-pay | Admitting: Orthopedic Surgery

## 2016-06-11 NOTE — Addendum Note (Signed)
Addendum  created 06/11/16 [id number] by Moshe SalisburyKaren E Nataliya Graig, CRNA   Modules edited: Charges VN

## 2016-06-11 NOTE — Telephone Encounter (Signed)
Patient requests work note - per recent treatment notes (surgical) by Dr Romeo AppleHarrison in [facility]; patient's scheduled follow up visit in office is 06/20/16.  Please advise how long out of work.

## 2016-06-11 NOTE — Care Management Note (Signed)
Case Management Note  Patient Details  Name: Jeremiah Anthony MRN: [id number] Date of Birth: Apr 30, 1991  Subjective/Objective:                  Pt admitted with dislocated hip. No repaired and ready for DC. Pt is from [place] (here visiting friends/family), employed and has insurance. Pt has no PCP in [place]. Pt states he is not interested in PT after DC and can do the exercises himself. Pt states he would like a cane. Pt has no preference of DME provider. Pt agreeable to have someone [facility] come talk to him about OOP cost for DME, as pt does not have insurance information at this time). Alroy BailiffLinda Lothian, of [facility], made aware of DME needs and will obtain pt info from chart and make visit to pt room.   Action/Plan: Pt discharging home today with self care.   Expected Discharge Date:  06/11/16               Expected Discharge Plan:  Home/Self Care  In-House Referral:  Financial Counselor  Discharge planning Services  CM Consult  Post Acute Care Choice:  Durable Medical Equipment Choice offered to:  Patient  DME Arranged:  Gilmer Morane DME Agency:  [facility]  HH [facility]    [facility] Agency:     Status of Service:  Completed, signed off  Medicare Important Message Given:    Date Medicare IM Given:    Medicare IM give by:    Date Additional Medicare IM Given:    Additional Medicare Important Message give by:     If discussed at Long Length of Stay Meetings, dates discussed:    Additional Comments:  Malcolm MetroChildress, Lya Holben Demske, RN 06/11/2016, 11:57 AM

## 2016-06-11 NOTE — Discharge Summary (Signed)
Physician Discharge Summary  Patient ID: Jeremiah Anthony MRN: [id number] DOB/AGE: [birth year] 25 y.o.  Admit date: 06/09/2016 Discharge date: 06/11/2016  Admission Diagnoses:Fracture dislocation right hip  Discharge Diagnoses: Same Active Problems:   Fracture dislocation of right hip joint [facility](HCC)   Discharged Condition: stable  Hospital Course: The patient came into the hospital emergency room on June 17 with a fracture dislocation of the right hip he had 2 attempted closed reduction by emergency room physician unsuccessful. Operating room for closed reduction under general anesthesia. He was then admitted for physical therapy.  He couldn't do the therapy the first day status second day for gait training and stair climbing  On day 3 he was stable with mild hip pain ambulatory including stairs and stable for discharge  Consults: None  Significant Diagnostic Studies: none  Treatments: surgery: Close reduction right hip under general anesthesia  Discharge Exam: Blood pressure 127/70, pulse 57, temperature 98 F (36.7 C), temperature source Oral, resp. rate 20, height 5\' 8"  (1.727 m), weight 172 lb (78.019 kg), SpO2 99 %. The patient has excellent motion in his right foot good strength no weakness sciatic nerve is intact sensations normal pulses are good  Disposition: 01-Home or Self Care  Discharge Instructions    Diet - low sodium heart healthy    Complete by:  As directed      Diet - low sodium heart healthy    Complete by:  As directed      Discharge instructions    Complete by:  As directed   Follow precautions as taught  Wear brace in bed and walking     Increase activity slowly    Complete by:  As directed      Increase activity slowly    Complete by:  As directed             Medication List    TAKE these medications        HYDROcodone-acetaminophen 7.5-325 MG tablet  Commonly known as:  NORCO  Take 1 tablet by mouth every 6 (six) hours.     ibuprofen 800 MG  tablet  Commonly known as:  ADVIL,MOTRIN  Take 1 tablet (800 mg total) by mouth every 8 (eight) hours as needed.           Follow-up Information    Follow up with [name], MD On 06/18/2016.   Specialties:  Orthopedic Surgery, Radiology   Why:  4:30   Contact information:   [address] [place] [address] [phone]       Signed: [name] 06/11/2016, 8:49 AM

## 2016-06-11 NOTE — Clinical Social Work Note (Signed)
CSW received consult for possible SNF. PT evaluated pt and plan is for return home with outpatient PT. CSW will sign off, but can be reconsulted if needed.  Derenda FennelKara Enio Hornback, LCSW [phone]

## 2016-06-11 NOTE — Progress Notes (Signed)
Discharge instructions read to patient and many family members Patient and family verbalized understanding of all instructions of all instructions. Discharged to home with mother

## 2016-06-11 NOTE — Progress Notes (Signed)
OT Screen Note  Patient Details Name: Philis KendallSean R Forgey MRN: [id number] DOB: Jan 08, 1991   Cancelled Treatment:    Reason Eval/Treat Not Completed: OT screened, no needs identified, will sign off   Limmie PatriciaLaura Takuya Lariccia, OTR/L,CBIS  [phone]  06/11/2016, 8:08 AM

## 2016-06-13 NOTE — Telephone Encounter (Signed)
Wait till 6/28

## 2016-06-14 NOTE — Telephone Encounter (Signed)
Called patient notified.

## 2016-06-18 ENCOUNTER — Encounter: Payer: Self-pay | Admitting: Orthopedic Surgery

## 2016-06-18 ENCOUNTER — Ambulatory Visit: Payer: Self-pay | Admitting: Orthopedic Surgery

## 2016-06-18 VITALS — BP 124/62 | Ht 68.0 in | Wt 172.0 lb

## 2016-06-18 DIAGNOSIS — Z4789 Encounter for other orthopedic aftercare: Secondary | ICD-10-CM

## 2016-06-18 DIAGNOSIS — S72001A Fracture of unspecified part of neck of right femur, initial encounter for closed fracture: Secondary | ICD-10-CM

## 2016-06-18 NOTE — Progress Notes (Signed)
Postop visit date of surgery June 17   Postop visit #1 status post close reduction of traumatic dislocation of the right hip with posterior acetabular fracture less than 40% of the acetabulum. Treated with closed reduction under anesthesia.  Patient was placed on crutches given posterior hip precautions presents for his first postop follow-up  He comes in today with no complaints of numbness tingling or pain or weakness in the foot does have some pain around the hip posteriorly and anteriorly.  He is walking intermittently without his cane and feels better without and without the knee immobilizer  His exam shows no instability of the hip in extremes of flexion adduction internal or external rotation. Sciatic nerve tests were normal  Recommend follow-up in 4-5 weeks  He can walk without the cane and without the knee immobilizer but should wear the knee immobilizer when sleeping for the next 4 weeks

## 2016-06-18 NOTE — Patient Instructions (Signed)
NO CANE IMMOBILIZER AT NIGHT ONLY

## 2016-06-19 ENCOUNTER — Telehealth: Payer: Self-pay | Admitting: Orthopedic Surgery

## 2016-06-19 ENCOUNTER — Other Ambulatory Visit: Payer: Self-pay | Admitting: *Deleted

## 2016-06-19 MED ORDER — HYDROCODONE-ACETAMINOPHEN 5-325 MG PO TABS: 1.0000 | ORAL_TABLET | Freq: Four times a day (QID) | ORAL | Status: AC | PRN

## 2016-06-19 NOTE — Telephone Encounter (Signed)
Patient called to inquire if he was to get a refill on: HYDROcodone-acetaminophen (NORCO) 7.5-325 MG tablet [[id number]][[id number]] - states still has the Ibuprofen - please advise.

## 2016-06-19 NOTE — Telephone Encounter (Signed)
Routing to Dr Harrison for approval 

## 2016-06-19 NOTE — Telephone Encounter (Signed)
norco 5 q 6 30

## 2016-06-20 NOTE — Telephone Encounter (Signed)
Prescription available 

## 2016-07-20 ENCOUNTER — Telehealth: Payer: Self-pay | Admitting: Orthopedic Surgery

## 2016-07-20 NOTE — Telephone Encounter (Signed)
Updated notes faxed to [facility], fax# [phone]; pt aware of status; authorization on file

## 2016-07-23 ENCOUNTER — Encounter: Payer: Self-pay | Admitting: Orthopedic Surgery

## 2016-07-23 ENCOUNTER — Ambulatory Visit: Payer: Self-pay | Admitting: Orthopedic Surgery

## 2016-07-23 ENCOUNTER — Ambulatory Visit (HOSPITAL_COMMUNITY)
Admission: RE | Admit: 2016-07-23 | Discharge: 2016-07-23 | Disposition: A | Discharge status: Home / Self Care | Payer: Self-pay | Source: Ambulatory Visit | Attending: Orthopedic Surgery | Admitting: Orthopedic Surgery

## 2016-07-23 VITALS — BP 126/83 | Ht 68.0 in | Wt 177.0 lb

## 2016-07-23 DIAGNOSIS — X58XXXD Exposure to other specified factors, subsequent encounter: Secondary | ICD-10-CM | POA: Insufficient documentation

## 2016-07-23 DIAGNOSIS — S72141D Displaced intertrochanteric fracture of right femur, subsequent encounter for closed fracture with routine healing: Secondary | ICD-10-CM | POA: Insufficient documentation

## 2016-07-23 DIAGNOSIS — S72001D Fracture of unspecified part of neck of right femur, subsequent encounter for closed fracture with routine healing: Secondary | ICD-10-CM

## 2016-07-23 DIAGNOSIS — Z4789 Encounter for other orthopedic aftercare: Secondary | ICD-10-CM

## 2016-07-23 NOTE — Patient Instructions (Addendum)
RETURN TO WORK AUG 14TH  Avoid running for 6 weeks then try jogging and progress as tolerated  Follow-up for any unusual symptoms increase in pain or instability Hip Dislocation A hip dislocation happens when your thigh bone (the ball) separates from your hip bone (the socket). Hip dislocation is an emergency. If you think you have a hip dislocation and cannot move your leg, get help right away. Do not try to move.  Your doctor will put your thigh bone back in the joint (the ball back in the socket). Sometimes this can be done without surgery. Surgery may be needed if blood vessels or nerves are damaged, or if the ball cannot be put back into the socket by hand. GET HELP RIGHT AWAY IF:  Your pain gets worse, not better.  You feel like your hip has dislocated again. MAKE SURE YOU:  Understand these instructions.  Will watch your condition.  Will get help right away if you are not doing well or get worse.   This information is not intended to replace advice given to you by your health care provider. Make sure you discuss any questions you have with your health care provider.   Document Released: 08/08/2011 Document Revised: 12/31/2014 Document Reviewed: 07/12/2015 Elsevier Interactive Patient Education [employer]

## 2016-07-23 NOTE — Progress Notes (Signed)
Chief Complaint  Patient presents with  . Follow-up    right hip closed reduction, DOS 06/09/16    Follow-up status post dislocation and fracture right hip status post close reduction under anesthesia  Patient 6 weeks post injury complains of mild intermittent pain in the front of the hip and also in the back of the hip. He did try to run and had difficulty doing that but otherwise his gait is returned to normal his activities have returned to normal. He drove down from [place] for his follow-up visit. He is only taking ibuprofen for discomfort  Review of systems he denies any numbness or tingling in the right leg  His exam shows normal range of motion leg length stability and strength in the right leg compared to the left. Neurovascular exam remains intact gait and station are normal mood and affect is normal  Impression fracture dislocation right hip status post close reduction  Plan I've given him instructions on gradual return to normal activities and exercise as well as percussions on what to look for in order to get follow-up if needed  Encounter Diagnoses  Name Primary?  . Closed fracture dislocation of hip joint with routine healing, right   . Surgical aftercare, musculoskeletal system Yes
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
## 2021-07-27 ENCOUNTER — Encounter (HOSPITAL_BASED_OUTPATIENT_CLINIC_OR_DEPARTMENT_OTHER): Admitting: Dermatology

## 2021-07-27 ENCOUNTER — Ambulatory Visit: Admit: 2021-07-27 | Discharge: 2021-07-27 | Payer: PRIVATE HEALTH INSURANCE | Attending: Dermatology

## 2021-07-27 ENCOUNTER — Encounter

## 2021-07-27 ENCOUNTER — Other Ambulatory Visit

## 2021-07-27 ENCOUNTER — Telehealth (HOSPITAL_BASED_OUTPATIENT_CLINIC_OR_DEPARTMENT_OTHER): Admitting: Dermatology

## 2021-07-27 DIAGNOSIS — D225 Melanocytic nevi of trunk: Secondary | ICD-10-CM

## 2021-07-27 MED ORDER — ketoconazole (NIZOral) 2 % cream
2 | TOPICAL | 2 refills | Status: AC
Start: 2021-07-27 — End: ?
  Filled 2021-07-27: qty 60, 20d supply, fill #0

## 2021-07-27 NOTE — Progress Notes (Signed)
 Dermatology follow up    HPI  Damon Vargas is a 30 y.o. male who presents for the following: Evaluation of skin lesions on body throughout. Denies bleeding/growing/crusted lesions. Denies new or changing moles. Here for skin cancer surveillance.     - no personal or fam hx of skin cancer  - hx of 2 moles removed in the past. One was as a kid and then about 5-6 years ago had a mole that was bx'd and then excised.   - patient points out a mole on his right upper abdomen   - wears spf 44 usually and sometimes a sun shirt  - last skin check was a few years ago     PHYSICAL EXAM:  Well appearing patient in no apparent distress; mood and affect are within normal limits. Skin exam was performed of the following and pertinent positives are included below:   Full skin exam including: head, face, neck, chest, abdomen, back, upper extremities, lower extremities, hands, feet, and buttocks.      ASSESSMENT/PLAN:    1. Melanocytic nevi of trunk  Objective   Right Upper Back:  Scattered multiple brown to pink macules and papules with regular pigment patterns on dermoscopy.    - Benign appearing, reassured.  Counseled on interval self-examinations, ABCDEs of melanoma, photo-protection, and to return to clinic if there are any changes or concerns.     2. History of dysplastic nevus  Objective   Left Shoulder - Posterior:  Scar with no re-pigmentation present.   Counseled on interval self-examinations, ABCDEs of melanoma, photo-protection, and to return to clinic if there are any changes or concerns.     3. Neoplasm of uncertain behavior  Objective   Left Flank:  Left flank with pink to light brown 4x48mm papule with central dark brown component, under dermoscopy radiating globular pattern.   DDx includes atypical nevus vs clear cell acanthoma  Discussed would recommend biopsy given unusual morphology, patient would like to RTC after the summer to do this. Message sent to Aashtha to schedule.     - Status: Undiagnosed new problem with  uncertain diagnosis     4. Tinea pedis of right foot  Objective   Right Foot - Posterior:  Right foot xerotic scale with extension to the medial foot from the sole. Leading scale. No webspace maceration. Left foot totally uninvolved.   -etiology and tx options discussed  - start ketoconazole cream BID until resolution. Then use once to twice weekly for maintenance to prevent recurrence.     - Status: Chronic illness with exacerbation, progression or side effects of treatment     Related Medications  ketoconazole (NIZOral) 2 % cream  Apply topically twice daily to affected areas, stop when clear. Apply once to twice weekly after for maintenance     Patient interested in mole mapping in the future. Message sent to [facility] to schedule with Dr Selena Batten    RTC 1 year for FBSE, will schedule in the next couple months for biopsy     Evette Georges, MD PGY4  Dermatology Resident  07/27/21 5:56 PM

## 2021-07-27 NOTE — Telephone Encounter (Signed)
 Can you please schedule patient for bx of mole on left flank on a Wed AM in the fall? Thanks!

## 2021-08-04 ENCOUNTER — Encounter (HOSPITAL_BASED_OUTPATIENT_CLINIC_OR_DEPARTMENT_OTHER): Admitting: Dermatology

## 2021-08-04 NOTE — Telephone Encounter (Signed)
 Thanks!  FYI Dr. Larey Brick

## 2021-10-25 ENCOUNTER — Other Ambulatory Visit

## 2021-10-25 ENCOUNTER — Ambulatory Visit: Admit: 2021-10-25 | Payer: PRIVATE HEALTH INSURANCE | Attending: Dermatology

## 2021-10-25 ENCOUNTER — Encounter (HOSPITAL_BASED_OUTPATIENT_CLINIC_OR_DEPARTMENT_OTHER): Admitting: Dermatology

## 2021-10-25 DIAGNOSIS — D485 Neoplasm of uncertain behavior of skin: Secondary | ICD-10-CM

## 2021-10-25 NOTE — Progress Notes (Signed)
 Follow-up derm patient    Past derm Hx  #nevis- 2 nevi removed in the past (once as a child and another 5-6 years ago), and another nevus bx'd then excised   #tinea pedis- right foot, treated with ketoconazole (NIZOral) 2 % cream    HPI  Last seen by: Jeffie Pollock MD on 07/27/2021  Damon Vargas is a 30 y.o. male who presents for the following: biopsy of lesion on left flank-given ugly duckling appearance    PMHx  -no personal h/o skin cancer    Fam Hx  -no fam h/o skin cancer    ROS  Patient feels well. No other skin complaints. No other systemic symptoms.      Current Outpatient Medications:   .  ketoconazole (NIZOral) 2 % cream, Apply topically twice daily to affected areas, stop when clear. Apply once to twice weekly after for maintenance (Patient not taking: Reported on 10/25/2021), Disp: 60 g, Rfl: 2  History reviewed. No pertinent past medical history.    Physical Exam/Impression/Plan  Well appearing patient in no apparent distress; mood and affect are within normal limits. Skin exam was performed of the following and pertinent positives are included below:   trunk    Neoplasm of uncertain behavior of skin  Left Flank  pink to light brown 4x2 mm papule with central dark brown component, under dermoscopy radiating globular pattern.   DDx includes atypical nevus vs clear cell acanthoma    Noticed during 07/27/21 visit, but pt deferred biopsy until after the summer              Shave Biopsy    Instrument used: DermaBlade    Hemostasis achieved with: aluminum chloride    Dressing type: bandage and Vaseline    Anesthetic: Injected  Injected:  Lidocaine 1% with epinephrine   Procedure Note: Written consent was obtained and risks were reviewed including but not limited to scarring, infection, bleeding, scabbing, incomplete removal, nerve damage and allergy to  anesthesia. Alternatives were discussed such as watchful waiting which could potentially lead to delayed diagnosis and treatment of suspicious skin condition.  All questions and concerns were answered to patient's satisfaction.  Consent signed by patient and attached to chart. Timeout was performed- location of biopsy, patient name, and date of birth confirmed. Area cleansed with alcohol prior to procedure.  Following the biopsy, Petrolatum and a bandage were applied. Patient will be notified of biopsy results. However, patient instructed to call the office if not contacted within 2 weeks. I reviewed with the patient in detail post-care instructions. Patient is to keep the biopsy site dry overnight, and then apply Vaseline twice daily until healed.     Specimen 1 - Tissue Pathology  pink to light brown 4x2 mm papule with central dark brown component, under dermoscopy radiating globular pattern.   DDx includes atypical nevus vs clear cell acanthoma      RTC 6 months for mole check    Scribe Attestation  I, Donzetta Sprung am scribing for, and in the presence of Lyn Hollingshead, MD.  Donzetta Sprung 10/25/21 9:30 AM    I, Lyn Hollingshead, MD, personally performed the services described in the documentation as scribed by Donzetta Sprung in my presence, and confirm it is both accurate and complete.

## 2021-10-30 LAB — TISSUE PATHOLOGY

## 2021-10-31 ENCOUNTER — Encounter (HOSPITAL_BASED_OUTPATIENT_CLINIC_OR_DEPARTMENT_OTHER)

## 2021-10-31 LAB — TISSUE PATHOLOGY

## 2021-11-15 ENCOUNTER — Encounter (HOSPITAL_BASED_OUTPATIENT_CLINIC_OR_DEPARTMENT_OTHER): Admitting: Dermatology

## 2021-11-17 ENCOUNTER — Other Ambulatory Visit

## 2021-11-17 ENCOUNTER — Other Ambulatory Visit (INDEPENDENT_AMBULATORY_CARE_PROVIDER_SITE_OTHER): Admitting: Dermatology

## 2021-11-17 MED ORDER — clindamycin (Cleocin T) 1 % lotion
1 | TOPICAL | 0 refills | 7.00000 days | Status: AC
Start: 2021-11-17 — End: ?
  Filled 2021-11-17: qty 60, 30d supply, fill #0

## 2021-11-17 NOTE — Telephone Encounter (Signed)
 Reviewed photos. I have not palpated lesion and cannot give a confirmatory diagnosis. From what I can see, it appears c/w folliculitis. I have sent clindamycin lotion to apply to the affected area 2-3 times a day until resolved. If worsening, he will need to go to nearest ER for further evaluation. My admin- Rito Ehrlich will do her best to schedule him next week in clinic. Thanks

## 2021-11-17 NOTE — Telephone Encounter (Signed)
 Vanessa/triage team,  I am out of the office today. I do not encourage photos of new lesions as they are very difficulty to interpret especially without palpation or examination on magnification with a dermatoscope. Please co-ordinate with my admin to schedule him at earliest available in clinic next week.   If he feels the bumps is getting bigger and draining, fevers or chills- he should go to the nearest ER/urgent care for evaluation.

## 2021-11-17 NOTE — Telephone Encounter (Signed)
-----   Message from [facility], MD sent at 11/17/2021  9:38 AM EST -----  Regarding: FW: Large red bump  Contact: [phone]        ----- Message -----  From: Victoriano Lain, LPN  Sent: 64/35/3912   9:36 AM EST  To: Lyn Hollingshead, MD  Subject: FW: Large red bump                               Spoke with patient he is going to upload a picture  ----- Message -----  From: Oretha Milch  Sent: 11/15/2021   7:59 PM EST  To: [facility] Clinical Support  Subject: Large red bump                                   I have a red bump that is pretty tender. It on my front by my waistline.  I think it?s been there for a week or two. It seems to have gotten more tender and red in the past couple days, since I traveled back from [place].     Should I come in? Can I send a picture?    Thank you,  Damon Vargas

## 2021-11-17 NOTE — Telephone Encounter (Signed)
 Hi,   Patient is scheduled with Dr Larey Brick on 11/28 offered the earliest date available. Also, informed pt that doctor have sent Clindamycin Lotion given instructions of use. Patient satisfied and would come for further evaluation. All set!  Thanks all  Aashtha

## 2021-11-20 ENCOUNTER — Ambulatory Visit: Admit: 2021-11-20 | Discharge: 2021-11-20 | Payer: PRIVATE HEALTH INSURANCE | Attending: Dermatology

## 2021-11-20 ENCOUNTER — Other Ambulatory Visit

## 2021-11-20 ENCOUNTER — Encounter (HOSPITAL_BASED_OUTPATIENT_CLINIC_OR_DEPARTMENT_OTHER): Admitting: Dermatology

## 2021-11-20 DIAGNOSIS — L0292 Furuncle, unspecified: Secondary | ICD-10-CM

## 2021-11-20 NOTE — Progress Notes (Signed)
 Derm follow up    Past Derm History:  #nevis- 2 nevi removed in the past (once as a child and another 5-6 years ago), and another nevus bx'd then excised   #tinea pedis- right foot, treated with ketoconazole (NIZOral) 2 % cream  #Bx 10/25/2021 from L flank: Compound Nevus, Deep Margin Is Involved.     HPI  Last seen by: Jeffie Pollock MD on 10/25/2021  Amiir Heckard is a 30 y.o. male who presents for the following:     1- Red bump on his groin  Started around 2-3 weeks ago   Thinks it was close after he shaved that area  Was very painful to touch  Thinks it started to shrink and dry up in the past 3-4 days and is not tender any more  Denies any fever, chills, drainage    2- Itchy spots on lower extremities  On and off for years  Gets worse on winter time  PCP recommended using oils after showering which he thinks is helpful     ROS  Patient feels well. No other skin complaints. No other systemic symptoms.    Physical Exam/Impression/Plan  Well appearing patient in no apparent distress; mood and affect are within normal limits. Skin exam was performed of the following and pertinent positives are included below:   Groin, lower extremities        Furuncle  Pubic  Erythematous papulonodule over mons pubis with peripheral collarette of scale.   Appears to be resolving and less erythematous. Minimally tender on touch. Does not require drainage or systemic antibiotics based on today''s examination  - START Clindamycin 1% lotion 1-2 times a day until resolution. (Prescription was previously sent to pharmacy-see Telephone encounters)  - Recommended using BP wash 1-2 times a week to potentially prevent recurrences - Side effects of BPO discussed, including: irritation, bleaching of clothing and towels.    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 IGA 2  - We  reviewed that this a chronic skin condition with intermittent flare    - Status: Chronic illness with exacerbation, progression or side effects of treatment   - Dry/sensitive skin care reviewed including mild fragrance-free soap, , limiting baths to 5-10 mins in lukewarm water, Free & Clear detergent, and avoiding all products with fragrances   - Stressed importance of liberal use of bland emollients as maintenance to minimize flares.   - Dry/sensitive skin handout was provided to the patient and recommended products written down  -discussed dietary modifications such as increasing protein and L-histidine powder to improve skin barrier  - Discussed starting prescription topical steroids but the patient prefers to try to moisturize.  - Discussed calling office if persistent or fails to improve    RTC as needed    Vernelle Emerald, MD  Resident  11/20/21 2:07 PM

## 2022-05-02 ENCOUNTER — Ambulatory Visit: Payer: PRIVATE HEALTH INSURANCE | Attending: Dermatology

## 2022-07-30 ENCOUNTER — Ambulatory Visit: Payer: PRIVATE HEALTH INSURANCE | Attending: Dermatology

## 2022-08-16 ENCOUNTER — Ambulatory Visit: Admit: 2022-08-16 | Discharge: 2022-08-16 | Payer: PRIVATE HEALTH INSURANCE | Attending: Dermatology

## 2022-08-16 DIAGNOSIS — D225 Melanocytic nevi of trunk: Secondary | ICD-10-CM

## 2022-08-16 MED ORDER — clindamycin (Cleocin T) 1 % lotion
1 | TOPICAL | 11 refills | 7.00000 days | Status: AC
Start: 2022-08-16 — End: ?
  Filled 2022-08-16: qty 60, 30d supply, fill #0

## 2022-08-16 NOTE — Progress Notes (Signed)
HPI  Derm f/u  Last seen by: Jeffie Pollock MD on 11/20/2021  Damon Vargas is a 31 y.o. male who presents for the following:     1. Skin lesions on body  - No personal or family hx of skin cancer or atypical moles  - Has had moles biopsied in the past, but these have all been benign. See hx below  - Presents today for skin check due to high number of moles and concern for skin cancer  - Wears SPF daily and sun shirts   - Tries to be mindful of his moles but most are on his back and notes that he cannot see / monitor these     Derm Hx:  - Furuncle, mons pubis, clindamycin lotion, recommended BPO wash  - Asteatotic eczema, OTC emollients discussed   - Tinea pedis, R foot, ketoconazole 2 % cream  - Compound nevus, L flank, s/p bx 10/2021   - Has had two moles biopsied in childhood (on right medial knee and left posterior shoulder). Unsure what pathology was but patient states these were benign.    ROS  Patient feels well. No other skin complaints. No other systemic symptoms.    Physical Exam/Impression/Plan  Well appearing patient in no apparent distress; mood and affect are within normal limits. Skin exam was performed of the following and pertinent positives are included below:   Face, neck, chest, back, abdomen, upper extremities, hands, lower extremities, feet, digits and nails, groin, buttocks, scalp.     Melanocytic nevi of trunk  Trunk  Scattered multiple brown to pink macules and papules with regular pigment patterns on dermoscopy.    - Discussed he may be a good candidate for mole mapping. Message sent to [facility] for scheduling  - Benign appearing, reassured.  Counseled on interval self-examinations, ABCDEs of melanoma, photo-protection, and to return to clinic if there are any changes or concerns.   - Recommended OTC sunscreen  and discussed photoprotection.     Keratosis pilaris (2)  Left Upper Arm - Anterior, Right Upper Arm - Anterior  Perifollicular keratotic papules noted on lateral upper arms.   Benign, reassured.   - Keratolytics such as alpha/beta hydroxyacids, ammonium lactate, urea discussed.    Tinea pedis of right foot  Right Foot - Posterior  Scaling and erythema in moccasin distribution on right foot.  - Infectious etiology and treatment options discussed.   - Resume ketoconazole cream BID to R foot until clear.    Related Medications  ketoconazole (NIZOral) 2 % cream  Apply topically twice daily to affected areas, stop when clear. Apply once to twice weekly after for maintenance    Furuncle  Pubic  Erythematous papule, perifollicular. Reports similar to previous lesion, now resolving. Had a white head that resolved    Plan:  - Start OTC BPO wash daily to this area to help prevent future breakouts  - Continue clindamycin PRN as spot treatment  - Side effects of BPO discussed, including: irritation, bleaching of clothing and towels.        clindamycin (Cleocin T) 1 % lotion - Pubic  Apply topically once to twice daily to affected areas    Acute photoallergic dermatitis  No rash today, but patient has photos on his phone from a recent trip to [facility] where he experienced red, painful erythematous papules coalescing into plaques on neck, arms, and legs after spending time in the heat, sun, and pools. Ddx sunburn, PMLE.  - Recommended OTC sunscreen  and  discussed photoprotection.   - Recommended OTC Heliocare/polypodium leucotomos  tid as adjunct therapy.  -written instructions provided      RTC in 1 year.    Raquel Sarna, MD  Resident  08/16/22 4:09 PM

## 2022-09-03 NOTE — Telephone Encounter (Signed)
called pt to schedule apt for mole mapping no answer lvm

## 2023-08-21 ENCOUNTER — Ambulatory Visit: Admit: 2023-08-21 | Discharge: 2023-08-21 | Payer: PRIVATE HEALTH INSURANCE | Attending: Dermatology

## 2023-08-21 DIAGNOSIS — L814 Other melanin hyperpigmentation: Secondary | ICD-10-CM

## 2023-08-21 DIAGNOSIS — B353 Tinea pedis: Secondary | ICD-10-CM

## 2023-08-21 MED ORDER — ketoconazole (NIZOral) 2 % cream
2 | TOPICAL | 2 refills | Status: AC
Start: 2023-08-21 — End: ?

## 2023-08-21 NOTE — Progress Notes (Signed)
Dermatology Followup Appointment  HPI  Last seen by: Jeffie Pollock MD on 08/16/2022  Damon Vargas is a 32 y.o. adult who presents for the following:     1. Skin lesions on body  - No personal or family hx of skin cancer or atypical moles  - Has had moles biopsied in the past, but these have all been benign. See hx below  - Presents today for skin check due to high number of moles and concern for skin cancer  - Wears SPF daily and sun shirts   - Tries to be mindful of his moles but most are on his back and notes that he cannot see / monitor these      2. Tinea pedis  - right foot  - previously prescribed ketoconazole 2% cream which he has used infrequently  - denies itching or pain    Derm Hx:  - Furuncle, mons pubis, clindamycin lotion, recommended BPO wash. Now resolved  - Asteatotic eczema, OTC emollients discussed   - Tinea pedis, R foot, ketoconazole 2 % cream  - Compound nevus, L flank, s/p bx 10/2021   - Has had two moles biopsied in childhood (on right medial knee and left posterior shoulder). Unsure what pathology was but patient states these were benign.     ROS  Patient feels well. No other skin complaints. No other systemic symptoms.     Physical Exam/Impression/Plan  Well appearing patient in no apparent distress; mood and affect are within normal limits. Skin exam was performed of the following and pertinent positives are included below:   Face, neck, chest, back, abdomen, upper extremities, hands, lower extremities, feet, digits and nails, groin, buttocks, scalp.      Melanocytic nevi of trunk  Trunk  Scattered multiple brown to pink macules and papules with regular pigment patterns on dermoscopy.    - Discussed he may be a good candidate for mole mapping. Message sent to [facility] for scheduling  - Benign appearing, reassured.  Counseled on interval self-examinations, ABCDEs of melanoma, photo-protection, and to return to clinic if there are any changes or concerns.   - Recommended OTC sunscreen  and  discussed photoprotection.      Tinea pedis of right foot  Right Foot - Posterior  Scaling and erythema in moccasin distribution on right foot with some interdigital maceration and nail dystrophy.  -status: chronic, flaring  - Infectious etiology and treatment options discussed.   - Resume ketoconazole cream BID to R foot until clear. Refills provided     Related Medications  ketoconazole (NIZOral) 2 % cream  Apply topically twice daily to affected areas, stop when clear. Apply once to twice weekly after for maintenance     Lentigines  Light brown macules and patches in photo-distributed areas.    -Benign, reassured.  Discussed photoprotection/sunscreen.    Congenital melanocytic nevus of skin  Left inner buttocks  < 1 cm corrugated brown plaque with no concerning features on dermoscopy  - Discussed sun protection and to return to clinic if changes in size, shape, color.       RTC in 1 year for FBSE or sooner prn.    Quintin Alto, MD  Resident  08/21/23 3:07 PM

## 2024-08-26 ENCOUNTER — Ambulatory Visit: Admit: 2024-08-26 | Discharge: 2024-08-26 | Payer: PRIVATE HEALTH INSURANCE | Attending: Dermatology

## 2024-08-26 MED ORDER — ketoconazole (NIZOral) 2 % cream
2 | TOPICAL | 3 refills | 30.00000 days | Status: AC
Start: 2024-08-26 — End: ?
  Filled 2024-08-26: qty 60, 30d supply, fill #0

## 2024-08-26 NOTE — Progress Notes (Signed)
 DERMATOLOGY FOLLOW UP VISIT     HPI  Last seen by: Derinda Marus MD on 07/2023  Harrie Cazarez is a 33 y.o. adult who presents for the following:     1. Skin lesions on body  - No personal or family hx of skin cancer or atypical moles  - Has had moles biopsied in the past, but these have all been benign. See hx below  - Presents today for skin check due to high number of moles and concern for skin cancer  - Wears SPF daily and sun shirts   - Tries to be mindful of his moles but most are on his back and notes that he cannot see / monitor these      2. Tinea pedis  - right foot  - previously prescribed ketoconazole  2% cream, hasn't been treating regularly  - denies itching or pain    Derm Hx:  - Furuncle, mons pubis, clindamycin  lotion, recommended BPO wash. Now resolved  - Asteatotic eczema, OTC emollients discussed   - Tinea pedis, R foot, ketoconazole  2 % cream  - Compound nevus, L flank, s/p bx 10/2021   - Has had two moles biopsied in childhood (on right medial knee and left posterior shoulder). Unsure what pathology was but patient states these were benign.    SHx: married long term girlfriend early 2025     ROS  Patient feels well. No other skin complaints. No other systemic symptoms.     Physical Exam/Impression/Plan  Well appearing patient in no apparent distress; mood and affect are within normal limits. Skin exam was performed of the following and pertinent positives are included below:   Face, neck, chest, back, abdomen, upper extremities, hands, lower extremities, feet, digits and nails, groin, buttocks, scalp.     Melanocytic nevi of trunk  Scattered multiple brown to pink macules and papules with regular pigment patterns on dermoscopy.    - Benign appearing, reassured.  Counseled on interval self-examinations,  photo-protection, and to return to clinic if there are any changes or concerns.   - Recommended OTC sunscreen  and discussed photoprotection.      Lentigines  Light brown macules and patches in  photo-distributed areas.    -Benign, reassured.  Discussed photoprotection/sunscreen.    Tinea pedis of right foot  Scaling and erythema in moccasin distribution on right foot with some interdigital maceration and nail dystrophy.  - status: chronic, flaring  - Infectious etiology and treatment options discussed.   - START: ketoconazole  cream BID to R foot until clear. Refills provided   -Side effects of topical medications discussed, including irritation, redness, allergic reaction.     Congenital melanocytic nevus of skin  Left inner buttocks  < 1 cm corrugated brown plaque with no concerning features on dermoscopy - stable  - Discussed sun protection and to return to clinic if changes in size, shape, color.       RTC in 1 year for FBSE or sooner prn.    Patient seen and discussed with Dr. Marus Wanda Corral, MD  Dermatology Resident  08/28/24
